# Patient Record
Sex: Male | Born: 1995 | Hispanic: Yes | Marital: Single
Health system: Southern US, Community
[De-identification: ages and names within clinical notes are randomized; demographics above are authoritative.]

---

## 2017-04-25 ENCOUNTER — Emergency Department (HOSPITAL_COMMUNITY)
Admission: EM | Admit: 2017-04-25 | Discharge: 2017-04-25 | Disposition: A | Discharge status: Home / Self Care | Payer: Self-pay | Attending: Emergency Medicine | Admitting: Emergency Medicine

## 2017-04-25 ENCOUNTER — Encounter (HOSPITAL_COMMUNITY): Payer: Self-pay

## 2017-04-25 ENCOUNTER — Emergency Department (HOSPITAL_COMMUNITY): Payer: Self-pay

## 2017-04-25 DIAGNOSIS — Y929 Unspecified place or not applicable: Secondary | ICD-10-CM | POA: Insufficient documentation

## 2017-04-25 DIAGNOSIS — W208XXA Other cause of strike by thrown, projected or falling object, initial encounter: Secondary | ICD-10-CM | POA: Insufficient documentation

## 2017-04-25 DIAGNOSIS — M79642 Pain in left hand: Secondary | ICD-10-CM | POA: Insufficient documentation

## 2017-04-25 DIAGNOSIS — T1490XA Injury, unspecified, initial encounter: Secondary | ICD-10-CM

## 2017-04-25 DIAGNOSIS — Y9389 Activity, other specified: Secondary | ICD-10-CM | POA: Insufficient documentation

## 2017-04-25 DIAGNOSIS — Y999 Unspecified external cause status: Secondary | ICD-10-CM | POA: Insufficient documentation

## 2017-04-25 MED ORDER — IBUPROFEN 200 MG PO TABS
600.0000 mg | ORAL_TABLET | Freq: Once | ORAL | Status: AC
  Administered 2017-04-25: 600 mg via ORAL
  Filled 2017-04-25: qty 1

## 2017-04-25 NOTE — ED Provider Notes (Signed)
[facility]-EMERGENCY DEPT Provider Note   CSN: [id number] Arrival date & time: 04/25/17  2146  By signing my name below, I, Teofilo Pod, attest that this documentation has been prepared under the direction and in the presence of [place] Rus[place], [place]. Electronically Signed: Teofilo Pod, ED Scribe. 04/25/2017. 10:33 PM.    History   Chief Complaint Chief Complaint  Patient presents with  . Hand Injury   The history is provided by the patient. A language interpreter was used ([place]).   HPI Comments:  Alan Bailey is a 21 y.o. male who presents to the Emergency Department s/p left hand injury that occurred PTA. Pt reports that he was changing a tire on his car and had placed his fingers inside the holes where the lug nuts go, the tire then fell and twisted his left hand. Pt does not take any medications regularly. No alleviating factors noted. Denies numbness, tingling or wound. Reports he is right-hand dominant, and is a Education administrator for work.  History reviewed. No pertinent past medical history.  There are no active problems to display for this patient.   History reviewed. No pertinent surgical history.     Home Medications    Prior to Admission medications   Not on File    Family History History reviewed. No pertinent family history.  Social History Social History  Substance Use Topics  . Smoking status: Not on file  . Smokeless tobacco: Not on file  . Alcohol use Not on file     Allergies   Patient has no allergy information on record.   Review of Systems Review of Systems  Musculoskeletal: Positive for arthralgias.  Neurological: Negative for numbness.     Physical Exam Updated Vital Signs BP (!) 145/87 (BP Location: Right Arm)   Pulse 95   Temp 98.7 F (37.1 C) (Oral)   Resp 18   Ht 5\' 11"  (1.803 m)   Wt 71.8 kg (158 lb 4 oz)   SpO2 100%   BMI 22.07 kg/m   Physical Exam  Constitutional: He appears well-developed and well-nourished. No  distress.  HENT:  Head: Normocephalic and atraumatic.  Eyes: Conjunctivae are normal.  Cardiovascular: Normal rate.   Intact radial and ulnar pulses.   Pulmonary/Chest: Effort normal.  Musculoskeletal: He exhibits no deformity.  Left hand: Edema to dorsal aspect of left hand over 1st MCP joint. Normal ROM of wrist, thumb, 3rd 4th and 5th fingers. Nl extension and flexion of index finger, though w pain. No anatomical snuffbox tenderness.   Neurological: No sensory deficit.  Psychiatric: He has a normal mood and affect. His behavior is normal.  Nursing note and vitals reviewed.    ED Treatments / Results  DIAGNOSTIC STUDIES:  Oxygen Saturation is 100% on RA, normal by my interpretation.    COORDINATION OF CARE:  10:30 PM Discussed treatment plan with pt at bedside and pt agreed to plan.   Labs (all labs ordered are listed, but only abnormal results are displayed) Labs Reviewed - No data to display  EKG  EKG Interpretation None       Radiology Dg Hand Complete Left  Result Date: 04/25/2017 CLINICAL DATA:  Hand injury while changing a wheel. Pain to the pointer and middle fingers on the posterior side. EXAM: LEFT HAND - COMPLETE 3+ VIEW COMPARISON:  None. FINDINGS: There is no evidence of fracture or dislocation. There is no evidence of arthropathy or other focal bone abnormality. Soft tissues are unremarkable. IMPRESSION: Negative. Electronically Signed  By: Burman NievesWilliam  Stevens M.D.   On: 04/25/2017 22:23    Procedures Procedures (including critical care time)  Medications Ordered in ED Medications  ibuprofen (ADVIL,MOTRIN) tablet 600 mg (600 mg Oral Given 04/25/17 2245)     Initial Impression / Assessment and Plan / ED Course  I have reviewed the triage vital signs and the nursing notes.  Pertinent labs & imaging results that were available during my care of the patient were reviewed by me and considered in my medical decision making (see chart for details).      Patient with left hand pain. X-ray without acute fracture or dislocation. Normal range of motion, NV intact. Vital signs stable, patient well-appearing. Symptoms improved with Advil and ice in ED. Will send w RICE therapy and symptomatic management. Referral to hand specialist given if symptoms do not improve. Patient safe for discharge home. Discussed results, findings, treatment and follow up. Patient advised of return precautions. Patient verbalized understanding and agreed with plan.   Final Clinical Impressions(s) / ED Diagnoses   Final diagnoses:  Left hand pain    New Prescriptions New Prescriptions   No medications on file  I personally performed the services described in this documentation, which was scribed in my presence. The recorded information has been reviewed and is accurate.     Russo, [place] N, PA-C 04/25/17 2337    Dione BoozeGlick, David, MD 04/25/17 [phone]

## 2017-04-25 NOTE — Discharge Instructions (Signed)
Please read instructions below. Apply ice to your hand for 20 minutes at a time. You can take 600 mg of Advil every 6 hours as needed for pain. Schedule an appointment with the hand specialist if your symptoms do not improve. Return to the ER for new or concerning symptoms.

## 2017-04-25 NOTE — ED Triage Notes (Signed)
Pt here for hand injury sts was changing wheel and it twisted and fell on hand.

## 2017-11-21 IMAGING — DX DG HAND COMPLETE 3+V*L*
3 series · 3 of 3 positions shown · non-contrast
Comparison: None.

CLINICAL DATA: Hand injury while changing a wheel. Pain to the
pointer and middle fingers on the posterior side.

EXAM:
LEFT HAND - COMPLETE 3+ VIEW

[x hand pa left]
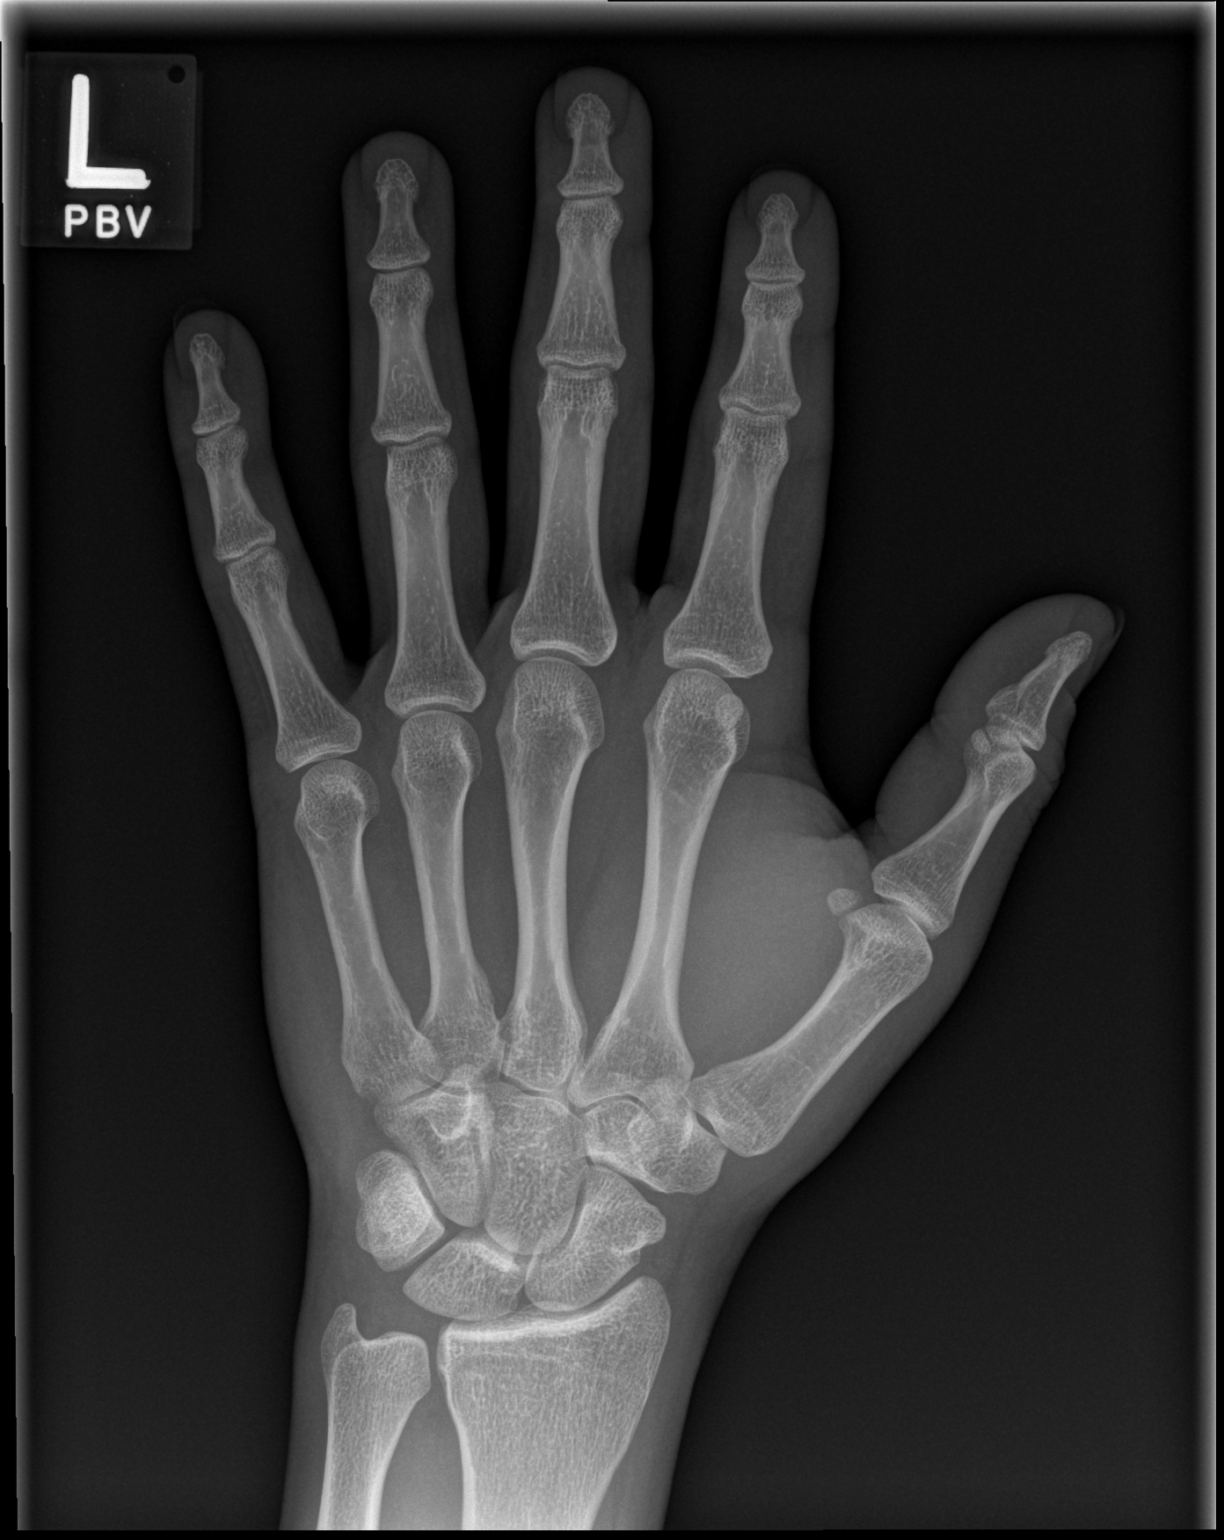

[x hand obl left]
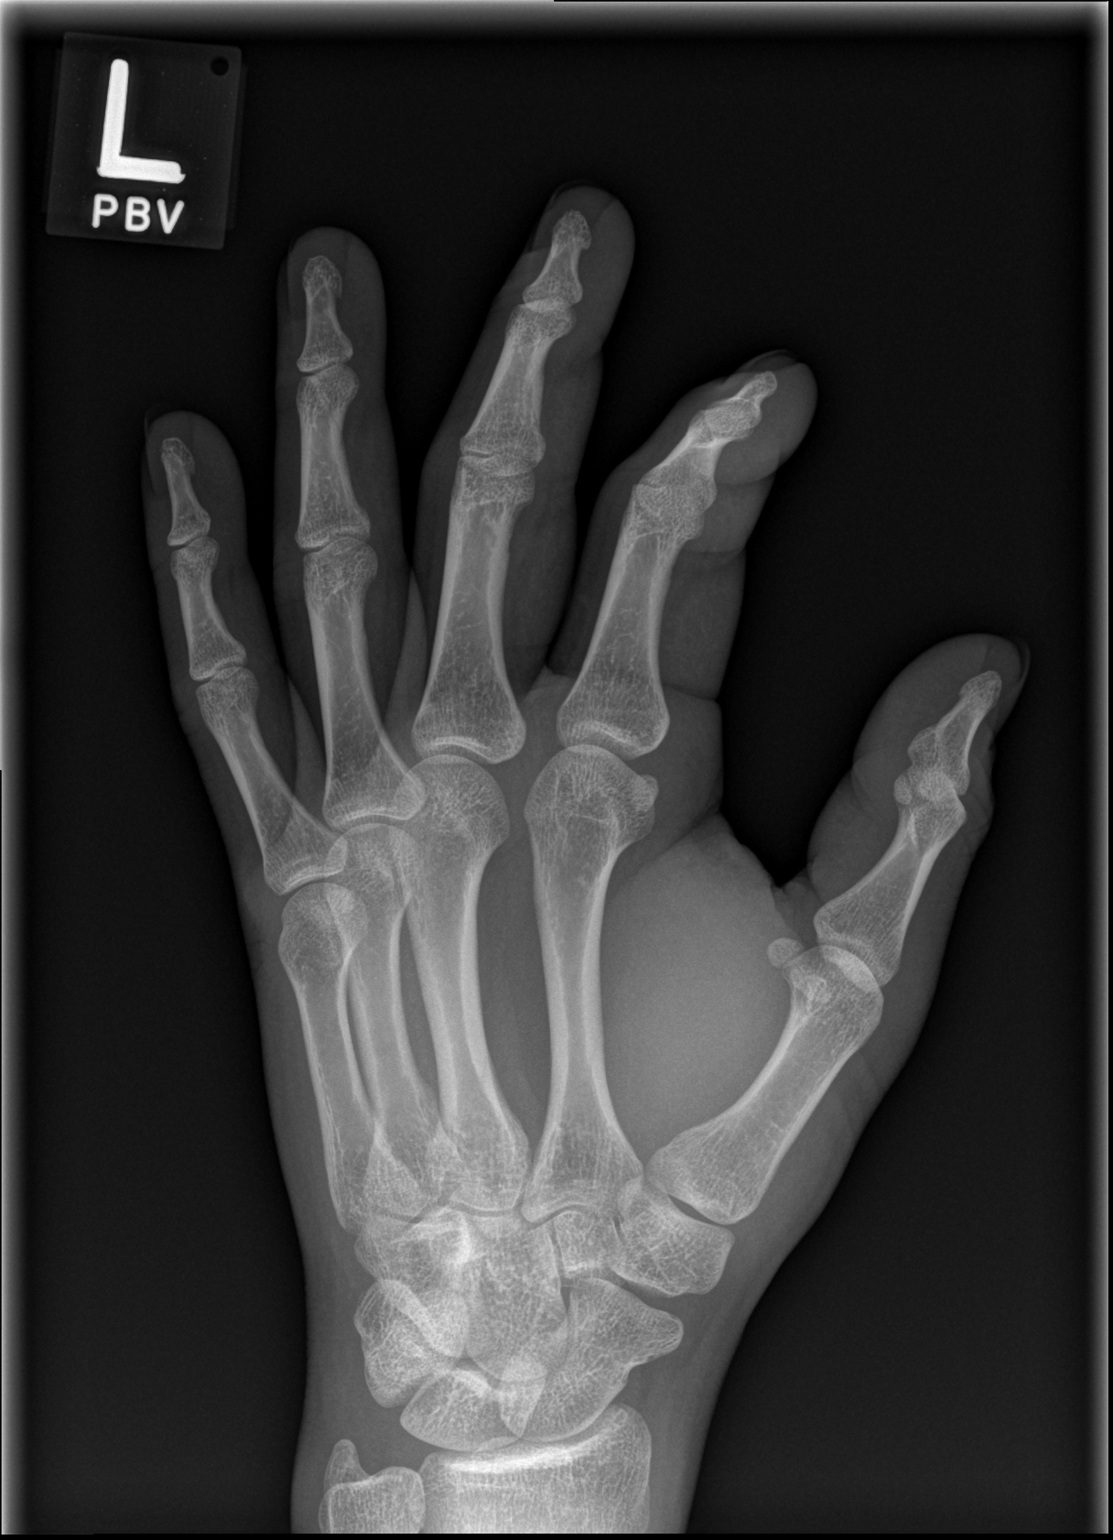

[x hand lat left]
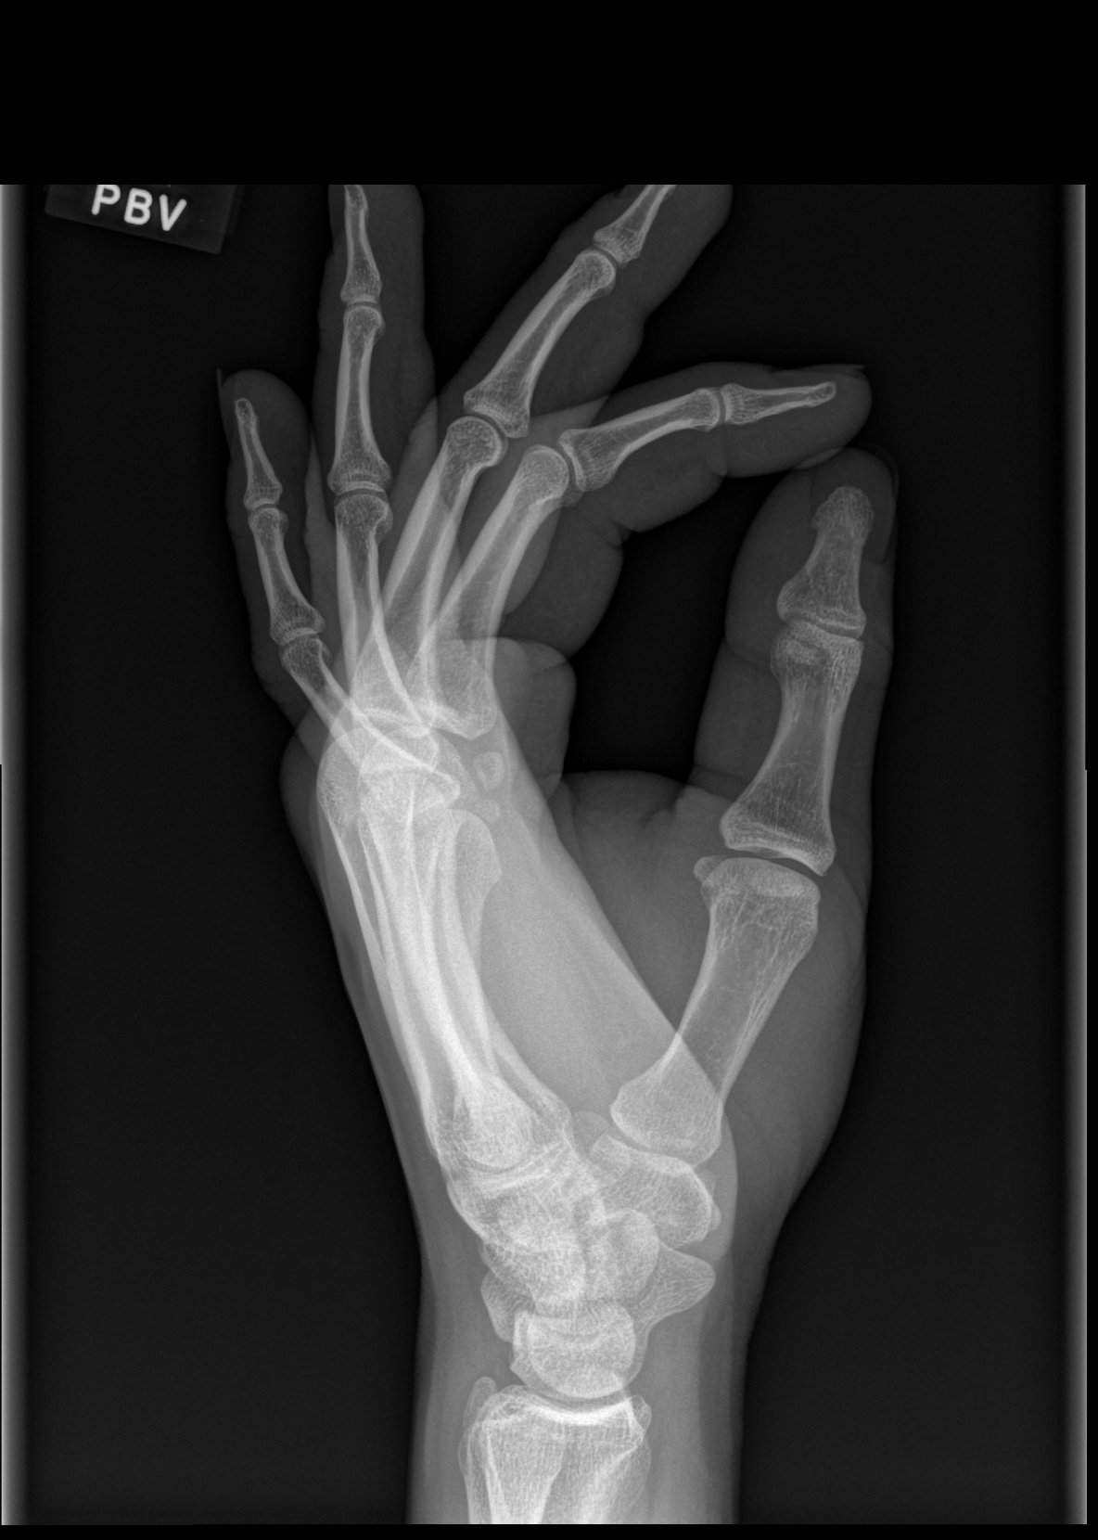

[3 of 3 positions shown; findings below may reference images not displayed]

FINDINGS: There is no evidence of fracture or dislocation. There is no
evidence of arthropathy or other focal bone abnormality. Soft
tissues are unremarkable.
IMPRESSION: Negative.

## 2019-12-24 ENCOUNTER — Ambulatory Visit: Payer: Self-pay | Attending: Internal Medicine

## 2019-12-24 DIAGNOSIS — Z20822 Contact with and (suspected) exposure to covid-19: Secondary | ICD-10-CM

## 2019-12-25 LAB — NOVEL CORONAVIRUS, NAA: SARS-CoV-2, NAA: NOT DETECTED
